# Patient Record
Sex: Female | Born: 1960 | Race: Black or African American | Hispanic: No | Marital: Single
Health system: Southern US, Community
[De-identification: ages and names within clinical notes are randomized; demographics above are authoritative.]

---

## 2019-04-15 ENCOUNTER — Other Ambulatory Visit: Payer: Self-pay

## 2019-04-15 ENCOUNTER — Emergency Department (HOSPITAL_COMMUNITY): Payer: PRIVATE HEALTH INSURANCE

## 2019-04-15 ENCOUNTER — Emergency Department (HOSPITAL_COMMUNITY)
Admission: EM | Admit: 2019-04-15 | Discharge: 2019-04-15 | Disposition: A | Discharge status: Home / Self Care | Payer: PRIVATE HEALTH INSURANCE | Attending: Emergency Medicine | Admitting: Emergency Medicine

## 2019-04-15 DIAGNOSIS — R4189 Other symptoms and signs involving cognitive functions and awareness: Secondary | ICD-10-CM | POA: Diagnosis not present

## 2019-04-15 DIAGNOSIS — R531 Weakness: Secondary | ICD-10-CM | POA: Diagnosis not present

## 2019-04-15 DIAGNOSIS — R42 Dizziness and giddiness: Secondary | ICD-10-CM | POA: Insufficient documentation

## 2019-04-15 DIAGNOSIS — R51 Headache: Secondary | ICD-10-CM | POA: Insufficient documentation

## 2019-04-15 DIAGNOSIS — R4182 Altered mental status, unspecified: Secondary | ICD-10-CM | POA: Diagnosis not present

## 2019-04-15 DIAGNOSIS — R4781 Slurred speech: Secondary | ICD-10-CM | POA: Insufficient documentation

## 2019-04-15 DIAGNOSIS — R0602 Shortness of breath: Secondary | ICD-10-CM | POA: Insufficient documentation

## 2019-04-15 DIAGNOSIS — R4701 Aphasia: Secondary | ICD-10-CM

## 2019-04-15 LAB — DIFFERENTIAL
Abs Immature Granulocytes: 0.02 10*3/uL (ref 0.00–0.07)
Basophils Absolute: 0 10*3/uL (ref 0.0–0.1)
Basophils Relative: 1 %
Eosinophils Absolute: 0.2 10*3/uL (ref 0.0–0.5)
Eosinophils Relative: 3 %
Immature Granulocytes: 0 %
Lymphocytes Relative: 45 %
Lymphs Abs: 2.7 10*3/uL (ref 0.7–4.0)
Monocytes Absolute: 0.8 10*3/uL (ref 0.1–1.0)
Monocytes Relative: 14 %
Neutro Abs: 2.2 10*3/uL (ref 1.7–7.7)
Neutrophils Relative %: 37 %

## 2019-04-15 LAB — I-STAT CHEM 8, ED
BUN: 14 mg/dL (ref 6–20)
Calcium, Ion: 1.15 mmol/L (ref 1.15–1.40)
Chloride: 106 mmol/L (ref 98–111)
Creatinine, Ser: 0.8 mg/dL (ref 0.44–1.00)
Glucose, Bld: 121 mg/dL — ABNORMAL HIGH (ref 70–99)
HCT: 41 % (ref 36.0–46.0)
Hemoglobin: 13.9 g/dL (ref 12.0–15.0)
Potassium: 4.6 mmol/L (ref 3.5–5.1)
Sodium: 142 mmol/L (ref 135–145)
TCO2: 26 mmol/L (ref 22–32)

## 2019-04-15 LAB — COMPREHENSIVE METABOLIC PANEL
ALT: 14 U/L (ref 0–44)
AST: 14 U/L — ABNORMAL LOW (ref 15–41)
Albumin: 3.9 g/dL (ref 3.5–5.0)
Alkaline Phosphatase: 42 U/L (ref 38–126)
Anion gap: 12 (ref 5–15)
BUN: 10 mg/dL (ref 6–20)
CO2: 22 mmol/L (ref 22–32)
Calcium: 9.1 mg/dL (ref 8.9–10.3)
Chloride: 106 mmol/L (ref 98–111)
Creatinine, Ser: 0.87 mg/dL (ref 0.44–1.00)
GFR calc Af Amer: >60 mL/min (ref >60)
GFR calc non Af Amer: >60 mL/min (ref >60)
Glucose, Bld: 129 mg/dL — ABNORMAL HIGH (ref 70–99)
Potassium: 3.7 mmol/L (ref 3.5–5.1)
Sodium: 140 mmol/L (ref 135–145)
Total Bilirubin: 0.5 mg/dL (ref 0.3–1.2)
Total Protein: 6.6 g/dL (ref 6.5–8.1)

## 2019-04-15 LAB — I-STAT BETA HCG BLOOD, ED (MC, WL, AP ONLY): I-stat hCG, quantitative: <5 m[IU]/mL (ref <5)

## 2019-04-15 LAB — URINALYSIS, ROUTINE W REFLEX MICROSCOPIC
Bilirubin Urine: NEGATIVE
Glucose, UA: NEGATIVE mg/dL
Hgb urine dipstick: NEGATIVE
Ketones, ur: NEGATIVE mg/dL
Leukocytes,Ua: NEGATIVE
Nitrite: NEGATIVE
Protein, ur: NEGATIVE mg/dL
Specific Gravity, Urine: 1.015 (ref 1.005–1.030)
pH: 8 (ref 5.0–8.0)

## 2019-04-15 LAB — POCT I-STAT EG7
Bicarbonate: 26.2 mmol/L (ref 20.0–28.0)
Calcium, Ion: 1.16 mmol/L (ref 1.15–1.40)
HCT: 40 % (ref 36.0–46.0)
Hemoglobin: 13.6 g/dL (ref 12.0–15.0)
O2 Saturation: 77 %
Potassium: 4.1 mmol/L (ref 3.5–5.1)
Sodium: 141 mmol/L (ref 135–145)
TCO2: 28 mmol/L (ref 22–32)
pCO2, Ven: 45.6 mmHg (ref 44.0–60.0)
pH, Ven: 7.366 (ref 7.250–7.430)
pO2, Ven: 44 mmHg (ref 32.0–45.0)

## 2019-04-15 LAB — RAPID URINE DRUG SCREEN, HOSP PERFORMED
Amphetamines: NOT DETECTED
Barbiturates: NOT DETECTED
Benzodiazepines: NOT DETECTED
Cocaine: NOT DETECTED
Opiates: NOT DETECTED
Tetrahydrocannabinol: NOT DETECTED

## 2019-04-15 LAB — CBC
HCT: 39.5 % (ref 36.0–46.0)
Hemoglobin: 12.8 g/dL (ref 12.0–15.0)
MCH: 29.5 pg (ref 26.0–34.0)
MCHC: 32.4 g/dL (ref 30.0–36.0)
MCV: 91 fL (ref 80.0–100.0)
Platelets: 209 10*3/uL (ref 150–400)
RBC: 4.34 MIL/uL (ref 3.87–5.11)
RDW: 14.8 % (ref 11.5–15.5)
WBC: 5.9 10*3/uL (ref 4.0–10.5)
nRBC: 0 % (ref 0.0–0.2)

## 2019-04-15 LAB — APTT: aPTT: 28 seconds (ref 24–36)

## 2019-04-15 LAB — PROTIME-INR
INR: 1 (ref 0.8–1.2)
Prothrombin Time: 13.4 seconds (ref 11.4–15.2)

## 2019-04-15 LAB — ETHANOL: Alcohol, Ethyl (B): <10 mg/dL (ref <10)

## 2019-04-15 LAB — AMMONIA: Ammonia: 16 umol/L (ref 9–35)

## 2019-04-15 MED ORDER — IOHEXOL 350 MG/ML SOLN
50.0000 mL | Freq: Once | INTRAVENOUS | Status: AC
  Administered 2019-04-15: 50 mL via INTRAVENOUS

## 2019-04-15 MED ORDER — PROCHLORPERAZINE EDISYLATE 10 MG/2ML IJ SOLN
10.0000 mg | Freq: Once | INTRAMUSCULAR | Status: AC
  Administered 2019-04-15: 10 mg via INTRAVENOUS
  Filled 2019-04-15: qty 2

## 2019-04-15 MED ORDER — DIPHENHYDRAMINE HCL 50 MG/ML IJ SOLN
25.0000 mg | Freq: Once | INTRAMUSCULAR | Status: AC
  Administered 2019-04-15: 25 mg via INTRAVENOUS
  Filled 2019-04-15: qty 1

## 2019-04-15 NOTE — ED Provider Notes (Signed)
Emergency Department Provider Note   I have reviewed the triage vital signs and the nursing notes.   HISTORY  Chief Complaint No chief complaint on file.   HPI Taylor Peterson is a 58 y.o. female who presents as a code stroke, nonverbal, LKW: 0400 when driving a child. Then severe headache, dizziness, slurred speech and right sided weakness. Worsened with EMS.    Level V Caveat 2/2 Mental Status and acuity  No past medical history on file.  There are no active problems to display for this patient.   Allergies Gabapentin and Sulfa antibiotics  No family history on file.  Social History Social History   Tobacco Use  . Smoking status: Not on file  Substance Use Topics  . Alcohol use: Not on file  . Drug use: Not on file    Review of Systems  Level V Caveat 2/2 Mental Status and acuity ____________________________________________   PHYSICAL EXAM:  VITAL SIGNS: Vitals:   04/15/19 0830 04/15/19 0837 04/15/19 0845 04/15/19 1009  BP: (!) 138/102  120/79 116/64  Pulse: 71  61 73  Resp:   19 16  Temp:      TempSrc:      SpO2: (!) 76%  97% 99%  Weight:  110.1 kg    Height:  5\' 4"  (1.626 m)      Constitutional: Alert nonverbal. Well appearing and in no acute distress. Eyes: Conjunctivae are normal. PERRL. EOMI. Head: Atraumatic. Nose: No congestion/rhinnorhea. Mouth/Throat: Mucous membranes are moist.  Oropharynx non-erythematous. Neck: No stridor.  No meningeal signs.   Cardiovascular: Normal rate, regular rhythm. Good peripheral circulation. Grossly normal heart sounds.   Respiratory: Normal respiratory effort.  No retractions. Lungs CTAB. Gastrointestinal: Soft and nontender. No distention.  Musculoskeletal: No lower extremity tenderness nor edema. No gross deformities of extremities. Neurologic:  Not able to fully assess 2/2 nonverbal and acuity  Skin:  Skin is warm, dry and intact. No rash noted.   ____________________________________________   LABS  (all labs ordered are listed, but only abnormal results are displayed)  Labs Reviewed  I-STAT CHEM 8, ED - Abnormal; Notable for the following components:      Result Value   Glucose, Bld 121 (*)    All other components within normal limits  CBC  DIFFERENTIAL  ETHANOL  PROTIME-INR  APTT  COMPREHENSIVE METABOLIC PANEL  RAPID URINE DRUG SCREEN, HOSP PERFORMED  URINALYSIS, ROUTINE W REFLEX MICROSCOPIC  I-STAT BETA HCG BLOOD, ED ([facility], [facility], [facility] ONLY)   ____________________________________________  EKG   EKG Interpretation  Date/Time:  Monday April 15 2019 06:57:05 EDT Ventricular Rate:  73 PR Interval:    QRS Duration: 93 QT Interval:  419 QTC Calculation: 462 R Axis:   17 Text Interpretation:  Sinus rhythm Low voltage, precordial leads Abnormal R-wave progression, early transition Borderline T abnormalities, anterior leads No old tracing to compare Confirmed by Deno Etienne [id number]) on 04/15/2019 7:08:39 AM       ____________________________________________  RADIOLOGY  Ct Head Code Stroke Wo Contrast  Result Date: 04/15/2019 CLINICAL DATA:  Code stroke. Focal neuro deficit with stroke suspected. Right-sided weakness EXAM: CT HEAD WITHOUT CONTRAST TECHNIQUE: Contiguous axial images were obtained from the base of the skull through the vertex without intravenous contrast. COMPARISON:  None. FINDINGS: Brain: No evidence of acute infarction, hemorrhage, hydrocephalus, extra-axial collection or mass lesion/mass effect. Vascular: No hyperdense vessel or unexpected calcification. Skull: Normal. Negative for fracture or focal lesion. Sinuses/Orbits: No acute finding. Other: These results were communicated  to Dr. Amada JupiterKirkpatrick at 6:31 amon 8/24/2020by text [place] via the [facility] messaging system. Reformats are not available at time of interpretation. ASPECTS [facility](Alberta Stroke Program Early CT Score) - Ganglionic level infarction (caudate, lentiform nuclei, internal capsule, insula, M1-M3 cortex): 7 -  Supraganglionic infarction (M4-M6 cortex): 3 Total score (0-10 with 10 being normal): 10 IMPRESSION: Negative head CT. ASPECTS is 10. Electronically Signed   By: Marnee SpringJonathon  Watts M.D.   On: 04/15/2019 06:32    ____________________________________________   PROCEDURES  Procedure(s) performed:   Procedures  CRITICAL CARE Performed by: Marily MemosJason Frayda Egley Total critical care time: 35 minutes Critical care time was exclusive of separately billable procedures and treating other patients. Critical care was necessary to treat or prevent imminent or life-threatening deterioration. Critical care was time spent personally by me on the following activities: development of treatment plan with patient and/or surrogate as well as nursing, discussions with consultants, evaluation of patient's response to treatment, examination of patient, obtaining history from patient or surrogate, ordering and performing treatments and interventions, ordering and review of laboratory studies, ordering and review of radiographic studies, pulse oximetry and re-evaluation of patient's condition.  ____________________________________________   INITIAL IMPRESSION / ASSESSMENT AND PLAN / ED COURSE  eval for stroke.   Patient was a code stroke for onset of symptoms arsound 0400 but unclear, improving symptoms while in ED. No tPA secondary to improvement and it seemed somewhat psychogenic, care transferred pending further reevalauations for return to baseline and likely discharge if so.     Pertinent labs & imaging results that were available during my care of the patient were reviewed by me and considered in my medical decision making (see chart for details).   ____________________________________________  FINAL CLINICAL IMPRESSION(S) / ED DIAGNOSES  Final diagnoses:  None     MEDICATIONS GIVEN DURING THIS VISIT:  Medications - No data to display   NEW OUTPATIENT MEDICATIONS STARTED DURING THIS VISIT:  New  Prescriptions   No medications on file    Note:  This note was prepared with assistance of Dragon voice recognition software. Occasional wrong-word or sound-a-like substitutions may have occurred due to the inherent limitations of voice recognition software.   Dakota Vanwart, Barbara CowerJason, MD 04/18/19 [phone]

## 2019-04-15 NOTE — Code Documentation (Addendum)
Responded to Code Stroke called at 0553 for slurred speech, headache, and weakness, LSN-[id number]. Pt arrived at 0612, CBG-121, NIH-23, CT head negative. CTA/CTP pending. Once back in room, patient began to move all extremities. Plan to monitor pt.

## 2019-04-15 NOTE — Consult Note (Signed)
Neurology Consultation Reason for Consult: Altered mental status Referring Physician: Mesner, J   CC: Altered mental status  History is obtained from: Patient  HPI: Taylor Peterson is a 58 y.o. female with unknown past medical history who presents unresponsive.  She apparently started having slurred speech and then progressed to altered mental status after arrival.  She is not verbally responsive on arrival therefore unable to obtain much history from her and I do not have contact information for the family.   LKW:?  4 AM tpa given?: no, stroke not suspected   ROS:  Unable to obtain due to altered mental status.   Past medical history: Unable to assess secondary to patient's altered mental status.    Family history: Unable to assess secondary to patient's altered mental status.   Social History: Unable to assess secondary to patient's altered mental status.    Exam: Current vital signs: Wt 110.1 kg    Vital signs in last 24 hours: Weight:  [110.1 kg] 110.1 kg (08/24 0600)   Physical Exam  Constitutional: Appears well-developed and well-nourished.  Psych: Affect appropriate to situation Eyes: No scleral injection HENT: No OP obstrucion Head: Normocephalic.  Cardiovascular: Normal rate and regular rhythm.  Respiratory: Effort normal, non-labored breathing GI: Soft.  No distension. There is no tenderness.  Skin: WDI  Neuro: Mental Status: Patient is awake, with eyes open but does not follow commands.  She is completely flaccid until her arms are held above her face at which point her arms bilaterally move away from her face before contacting it when dropped from above. Cranial Nerves: II: Blinks to threat bilaterally pupils are equal, round, and reactive to light.   III,IV, VI: Actively resist checking doll's eye maneuver V: VII: Face is symmetric, blinks to eyelid stimulation bilaterally VIII, X, XI, XII: Unable to assess secondary to patient's altered mental status.   Motor: She withdraws to noxious stimulation in all 4 extremities Sensory: As above  Cerebellar: Does not perform  Following my initial assessment, I entered the room and asked that I suspected she was improving.  Subsequently, she began following commands in all 4 extremities and becoming more interactive answering some simple yes/no questions with her head shakes  I have reviewed labs in epic and the results pertinent to this consultation are: Creatinine 0.8 I-STAT Chem-8 unremarkable  I have reviewed the images obtained: CT head-no acute findings, CTA-no LVO  Impression: 58 year old female with progressively decreasing mental status with the appearance of unresponsiveness, though with blink to threat and avoidance of her face preserved.  At this time, I think the most likely etiology is psychogenic.  I do not have family to confirm last known well, and her exam is inconsistent with acute ischemic stroke, and therefore I would not favor proceeding with IV TPA at this time.  She is improving and has a completely nonfocal exam.  Postictal state would be 1 possibility, but there was no report of seizure.  His only she continues to improve and returns to baseline, I think that this could be followed up as an outpatient.  If she continues to be confused, then an MRI and EEG would be prudent.  Recommendations: 1) as long she continues to improve, could follow-up as an outpatient with neurology with driving prohibitions 2) if she continues to be confused, then MRI/EEG will be needed.   Roland Rack, MD [facility] [phone]  If 7pm- 7am, please [facility] on call as listed in [place].

## 2019-04-15 NOTE — ED Triage Notes (Signed)
Per EMS, pt LKW was 4 am after she drove her son to work.  It is reported that she had a sudden onset of dizziness then fell.  Found altered w/ slurred speech initially, reporting headache, nausea and mobility issues.  Pt seemed to get worse as EMS was bringing her in.  Upon arrival she was unresponsive/uncooperative.

## 2019-04-15 NOTE — ED Notes (Signed)
Patient transported to MRI 

## 2019-04-15 NOTE — Discharge Instructions (Signed)
Please follow up with your family doctor.

## 2019-04-15 NOTE — ED Provider Notes (Signed)
58 year old female arrived as a code stroke.  I received the patient in signout from Dr. Dayna Barker.  Briefly the patient had what was presumed to be acute onset aphasia after having a headache and slurred speech.  Seen by neurology on arrival initial imaging unremarkable.  Not thought to be focal and having some improvement plan was to discharge home if the patient continued to improve.  Upon my exam after the imaging had resulted formally the patient is still not conversant.  Will follow commands.  Will order an MRI.  Broaden the altered mental status work-up.  Son is at bedside with some further history and states that the patient was a bit off yesterday.  Called today frantic short of breath and by the time he arrived she was not talking.  MRI is read as normal.  Extended work-up was also unremarkable.  UDS is negative urine without infection.  ABG without hypercarbia.  Ammonia is normal.  I assessed the patient after MRI and she is now back to her baseline.  Will discharge the patient home.   Deno Etienne, DO 04/15/19 1005

## 2019-05-09 MED ORDER — PHENYLEPHRINE HCL-NACL 10-0.9 MG/250ML-% IV SOLN
INTRAVENOUS | Status: AC
  Filled 2019-05-09: qty 250

## 2020-05-19 IMAGING — CT CT HEAD CODE STROKE W/O CM
4 series · 16 of 47 positions shown, 18 images · non-contrast
Comparison: None.

CLINICAL DATA: Code stroke. Focal neuro deficit with stroke
suspected. Right-sided weakness

EXAM:
CT HEAD WITHOUT CONTRAST
TECHNIQUE: Contiguous axial images were obtained from the base of the skull
through the vertex without intravenous contrast.

[Series 3: head wo · axial · 0.43mm/px · z∈[-118,-18]mm · 6 of 30 slices shown, 8 images]
[im 5/30  brain]
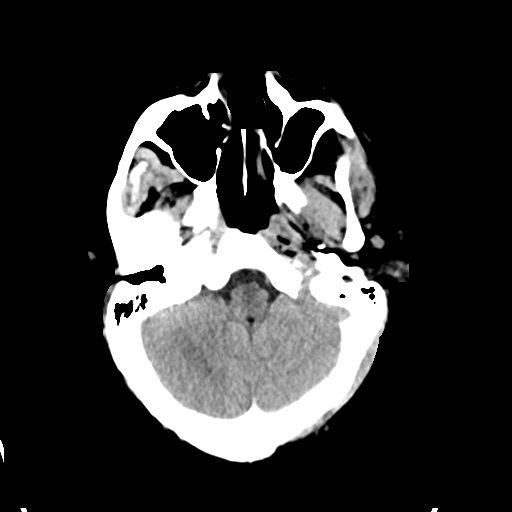
[im 5/30  bone]
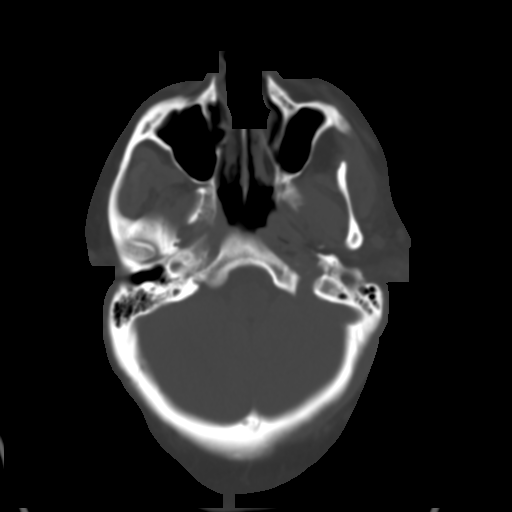
[im 9/30  brain]
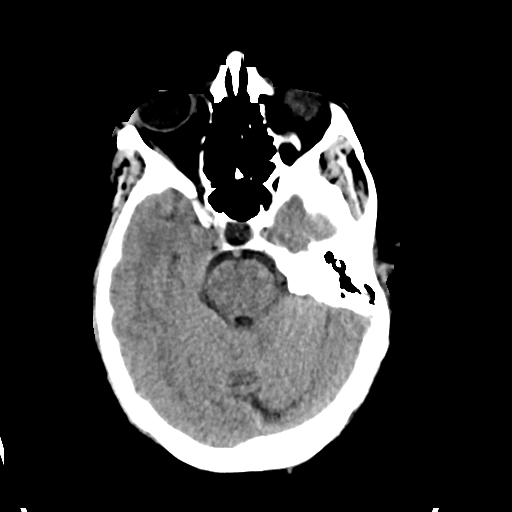
[im 13/30  brain]
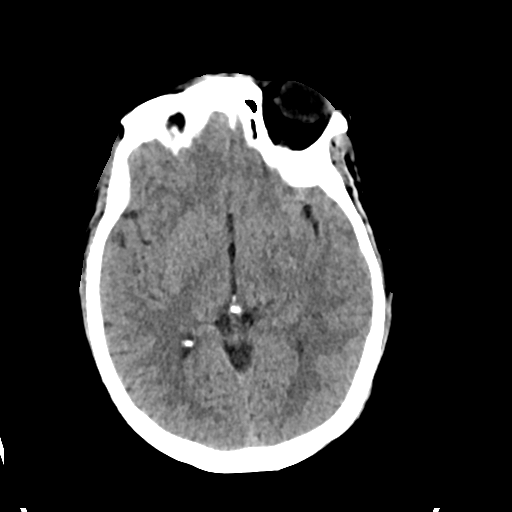
[im 17/30  brain]
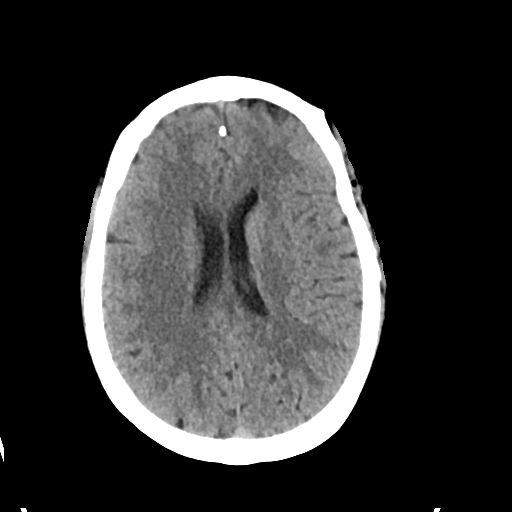
[im 21/30  brain]
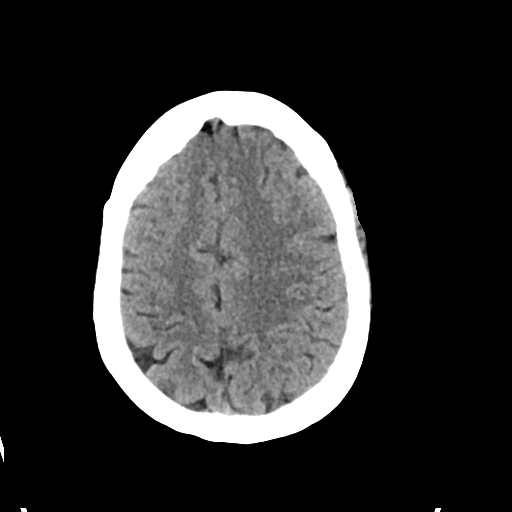
[im 21/30  bone]
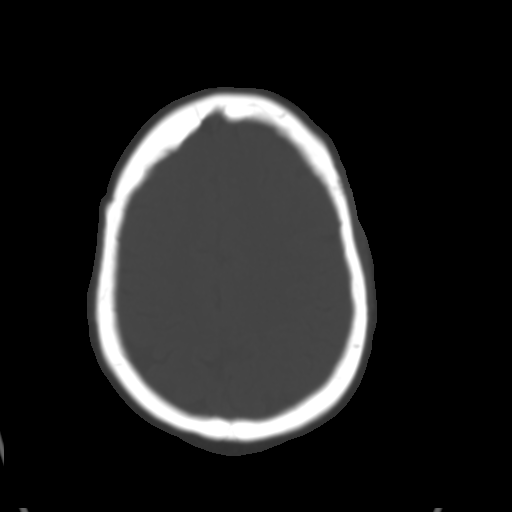
[im 25/30  brain]
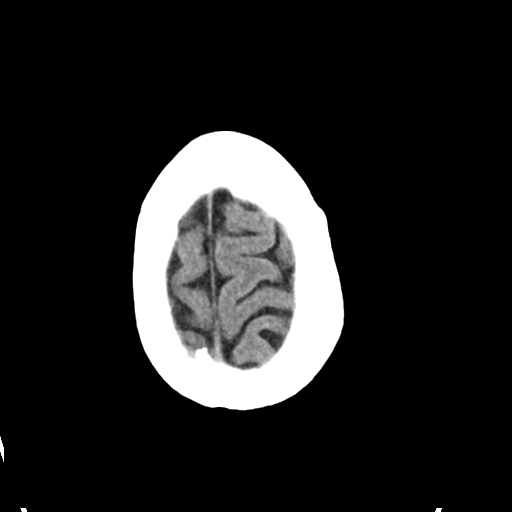

[Series 5: head bone · axial · 0.43mm/px · z∈[-124,-74]mm · 4 of 76 slices shown]
[im 8/76  bone]
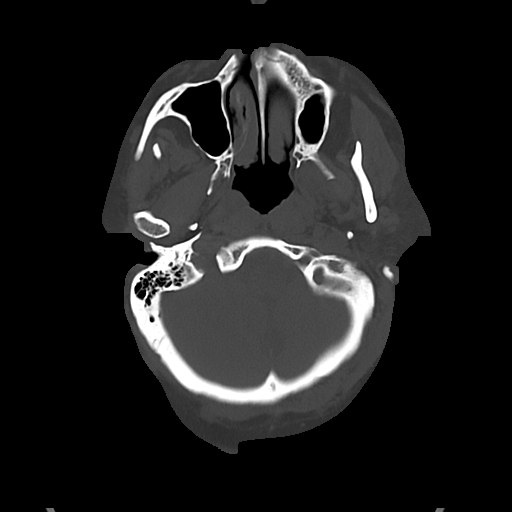
[im 15/76  bone]
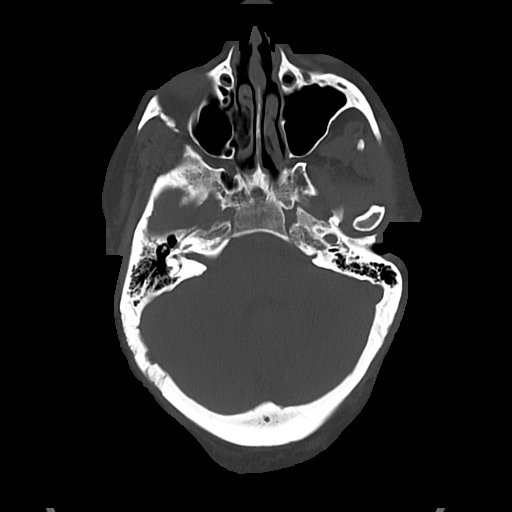
[im 26/76  bone]
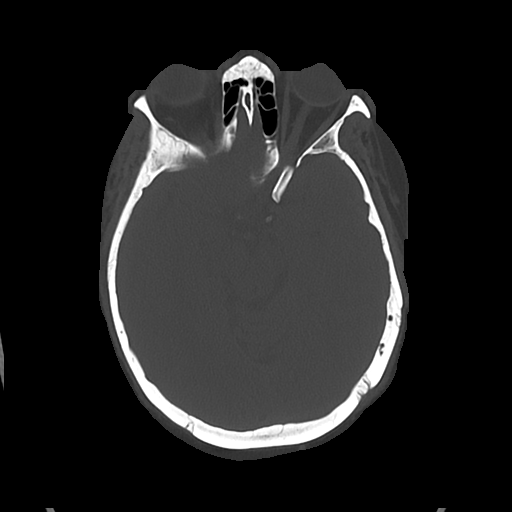
[im 33/76  bone]
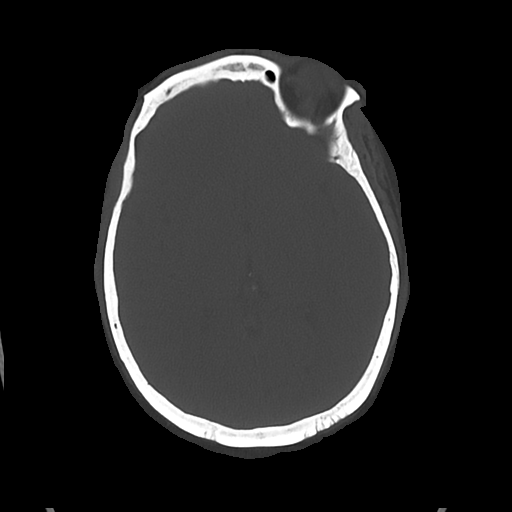

[Series 6: cor soft · coronal · 0.32mm/px · 3 of 71 slices shown]
[im 24/71  brain]
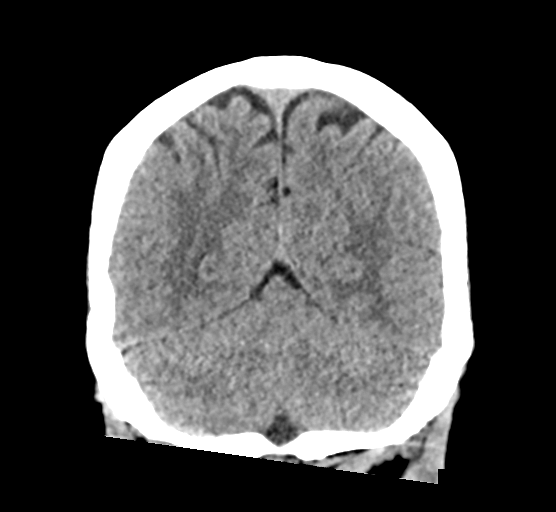
[im 32/71  brain]
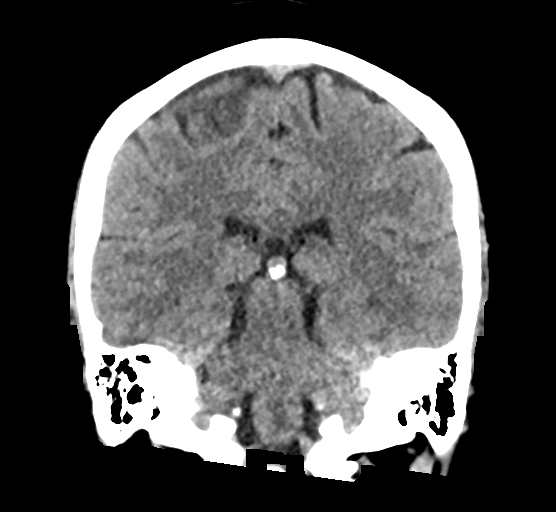
[im 39/71  brain]
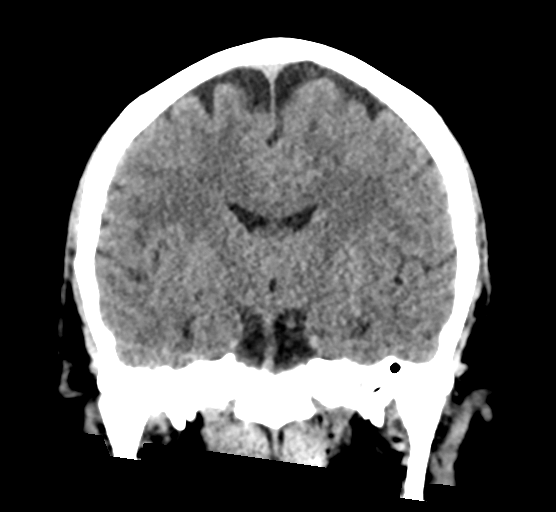

[Series 7: sag soft · sagittal · 0.32mm/px · 3 of 59 slices shown]
[im 22/59  brain]
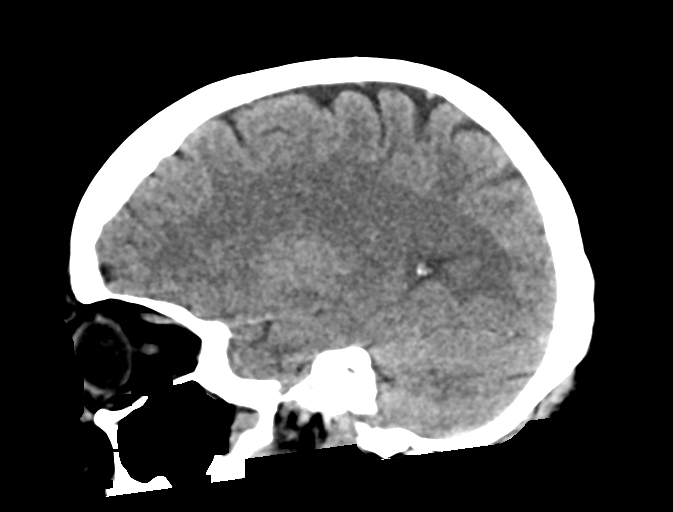
[im 30/59  brain]
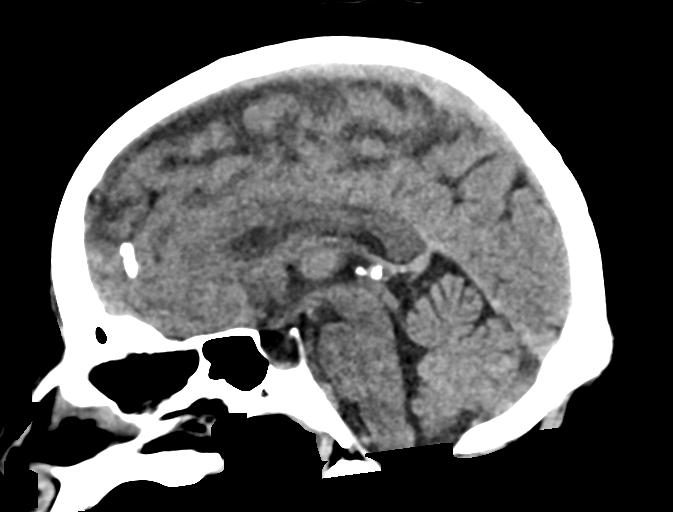
[im 37/59  brain]
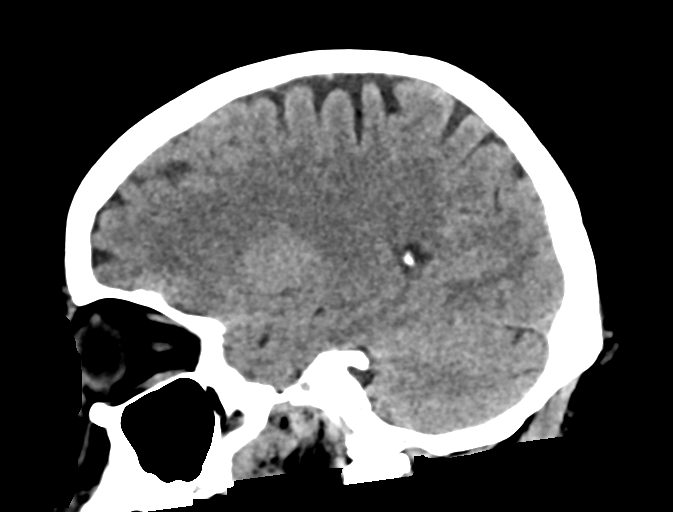

[16 of 47 positions shown; findings below may reference images not displayed]

FINDINGS: Brain: No evidence of acute infarction, hemorrhage, hydrocephalus,
extra-axial collection or mass lesion/mass effect.

Vascular: No hyperdense vessel or unexpected calcification.

Skull: Normal. Negative for fracture or focal lesion.

Sinuses/Orbits: No acute finding.

Other: These results were communicated to Dr. Hermann Peter at [DATE]
[place] 04/15/2019by text page via the [employer] messaging system.

Reformats are not available at time of interpretation.

ASPECTS (Alberta Stroke Program Early CT Score)

- Ganglionic level infarction (caudate, lentiform nuclei, internal
capsule, insula, M1-M3 cortex): 7

- Supraganglionic infarction (M4-M6 cortex): 3

Total score (0-10 with 10 being normal): 10
IMPRESSION: Negative head CT. ASPECTS is 10.
